# Patient Record
Sex: Male | Born: 1980 | Race: White | Hispanic: No | Marital: Single | Smoking: Current every day smoker
Health system: Southern US, Community
[De-identification: ages and names within clinical notes are randomized; demographics above are authoritative.]

## PROBLEM LIST (undated history)

## (undated) DIAGNOSIS — K5792 Diverticulitis of intestine, part unspecified, without perforation or abscess without bleeding: Secondary | ICD-10-CM

## (undated) HISTORY — PX: CLEFT LIP REPAIR: SUR1164

---

## 2001-12-28 ENCOUNTER — Emergency Department (HOSPITAL_COMMUNITY): Admission: EM | Admit: 2001-12-28 | Discharge: 2001-12-28 | Payer: Self-pay | Admitting: Emergency Medicine

## 2001-12-28 ENCOUNTER — Encounter: Payer: Self-pay | Admitting: Emergency Medicine

## 2003-08-22 ENCOUNTER — Emergency Department (HOSPITAL_COMMUNITY): Admission: EM | Admit: 2003-08-22 | Discharge: 2003-08-22 | Payer: Self-pay | Admitting: *Deleted

## 2003-09-24 ENCOUNTER — Inpatient Hospital Stay (HOSPITAL_COMMUNITY): Admission: EM | Admit: 2003-09-24 | Discharge: 2003-09-26 | Payer: Self-pay | Admitting: Emergency Medicine

## 2007-08-18 ENCOUNTER — Emergency Department (HOSPITAL_COMMUNITY): Admission: EM | Admit: 2007-08-18 | Discharge: 2007-08-18 | Payer: Self-pay | Admitting: Emergency Medicine
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
## 2012-10-16 ENCOUNTER — Emergency Department (HOSPITAL_COMMUNITY): Payer: No Typology Code available for payment source

## 2012-10-16 ENCOUNTER — Encounter (HOSPITAL_COMMUNITY): Payer: Self-pay | Admitting: Nurse Practitioner

## 2012-10-16 ENCOUNTER — Emergency Department (HOSPITAL_COMMUNITY)
Admission: EM | Admit: 2012-10-16 | Discharge: 2012-10-16 | Disposition: A | Discharge status: Home / Self Care | Payer: No Typology Code available for payment source | Attending: Emergency Medicine | Admitting: Emergency Medicine

## 2012-10-16 DIAGNOSIS — M546 Pain in thoracic spine: Secondary | ICD-10-CM | POA: Insufficient documentation

## 2012-10-16 DIAGNOSIS — Y92410 Unspecified street and highway as the place of occurrence of the external cause: Secondary | ICD-10-CM | POA: Insufficient documentation

## 2012-10-16 DIAGNOSIS — M25532 Pain in left wrist: Secondary | ICD-10-CM

## 2012-10-16 DIAGNOSIS — F17200 Nicotine dependence, unspecified, uncomplicated: Secondary | ICD-10-CM | POA: Insufficient documentation

## 2012-10-16 DIAGNOSIS — M25519 Pain in unspecified shoulder: Secondary | ICD-10-CM | POA: Insufficient documentation

## 2012-10-16 DIAGNOSIS — M25512 Pain in left shoulder: Secondary | ICD-10-CM

## 2012-10-16 DIAGNOSIS — M25529 Pain in unspecified elbow: Secondary | ICD-10-CM | POA: Insufficient documentation

## 2012-10-16 DIAGNOSIS — Y9389 Activity, other specified: Secondary | ICD-10-CM | POA: Insufficient documentation

## 2012-10-16 MED ORDER — ACETAMINOPHEN 500 MG PO TABS: 500.0000 mg | ORAL_TABLET | Freq: Four times a day (QID) | ORAL | Status: DC | PRN

## 2012-10-16 MED ORDER — HYDROCODONE-ACETAMINOPHEN 5-325 MG PO TABS
2.0000 | ORAL_TABLET | Freq: Once | ORAL | Status: AC
  Administered 2012-10-16: 2 via ORAL
  Filled 2012-10-16: qty 2
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
## 2012-10-17 NOTE — ED Provider Notes (Signed)
Medical screening examination/treatment/procedure(s) were performed by non-physician practitioner and as supervising physician I was immediately available for consultation/collaboration.   Junius Argyle, MD 10/17/12 1328

## 2014-04-16 ENCOUNTER — Emergency Department (HOSPITAL_BASED_OUTPATIENT_CLINIC_OR_DEPARTMENT_OTHER): Payer: Medicaid Other

## 2014-04-16 ENCOUNTER — Encounter (HOSPITAL_BASED_OUTPATIENT_CLINIC_OR_DEPARTMENT_OTHER): Payer: Self-pay | Admitting: *Deleted

## 2014-04-16 ENCOUNTER — Emergency Department (HOSPITAL_BASED_OUTPATIENT_CLINIC_OR_DEPARTMENT_OTHER)
Admission: EM | Admit: 2014-04-16 | Discharge: 2014-04-16 | Disposition: A | Discharge status: Home / Self Care | Payer: Medicaid Other | Attending: Emergency Medicine | Admitting: Emergency Medicine

## 2014-04-16 DIAGNOSIS — Y9389 Activity, other specified: Secondary | ICD-10-CM | POA: Insufficient documentation

## 2014-04-16 DIAGNOSIS — Y998 Other external cause status: Secondary | ICD-10-CM | POA: Diagnosis not present

## 2014-04-16 DIAGNOSIS — Z720 Tobacco use: Secondary | ICD-10-CM | POA: Diagnosis not present

## 2014-04-16 DIAGNOSIS — W230XXA Caught, crushed, jammed, or pinched between moving objects, initial encounter: Secondary | ICD-10-CM | POA: Insufficient documentation

## 2014-04-16 DIAGNOSIS — Y9289 Other specified places as the place of occurrence of the external cause: Secondary | ICD-10-CM | POA: Diagnosis not present

## 2014-04-16 DIAGNOSIS — S6991XA Unspecified injury of right wrist, hand and finger(s), initial encounter: Secondary | ICD-10-CM | POA: Diagnosis not present

## 2014-04-16 MED ORDER — OXYCODONE-ACETAMINOPHEN 5-325 MG PO TABS: 1.0000 | ORAL_TABLET | Freq: Four times a day (QID) | ORAL | Status: DC | PRN

## 2014-04-16 MED ORDER — IBUPROFEN 600 MG PO TABS: 600.0000 mg | ORAL_TABLET | Freq: Four times a day (QID) | ORAL | Status: DC | PRN

## 2014-04-16 MED ORDER — OXYCODONE-ACETAMINOPHEN 5-325 MG PO TABS
1.0000 | ORAL_TABLET | Freq: Once | ORAL | Status: AC
  Administered 2014-04-16: 1 via ORAL
  Filled 2014-04-16: qty 1

## 2014-04-16 MED ORDER — IBUPROFEN 800 MG PO TABS
800.0000 mg | ORAL_TABLET | Freq: Once | ORAL | Status: AC
Start: 2014-04-16 — End: 2014-04-16
  Administered 2014-04-16: 800 mg via ORAL
  Filled 2014-04-16: qty 1

## 2014-04-16 NOTE — ED Provider Notes (Signed)
CSN: [id number]     Arrival date & time 04/16/14  1939 History   First MD Initiated Contact with Patient 04/16/14 2001     Chief Complaint  Patient presents with  . Hand Injury     (Consider location/radiation/quality/duration/timing/severity/associated sxs/prior Treatment) HPI Pt is a 34yo male presenting to ED with c/o sudden onset severe Right hand pain after he got it caught in a mixing bowl in cooking class this evening while making a tart.  Pt states he was feeling the texture of his mixture when the machine accidentally got switched back on.  Pain is severe, aching and throbbing, 10/10, worse with palpation and movement.  Pt states pain is worse over his knuckles of index, middle, and ring finger with tingling sensation in those fingers. No wound but reports bruising. No pain medication PTA as pt states he came straight to the ED.  No other injuries.    History reviewed. No pertinent past medical history. History reviewed. No pertinent past surgical history. No family history on file. History  Substance Use Topics  . Smoking status: Current Every Day Smoker    Types: Cigarettes  . Smokeless tobacco: Not on file  . Alcohol Use: No    Review of Systems  Musculoskeletal: Positive for myalgias, joint swelling and arthralgias.  Skin: Positive for color change. Negative for wound.  Neurological: Positive for weakness ( Right hand- due to pain) and numbness ( tingling in Right fingers ).  All other systems reviewed and are negative.     Allergies  Review of patient's allergies indicates no known allergies.  Home Medications   Prior to Admission medications   Medication Sig Start Date End Date Taking? Authorizing Provider  acetaminophen (TYLENOL) 500 MG tablet Take 1 tablet (500 mg total) by mouth every 6 (six) hours as needed for pain. 10/16/12   Junius FinnerErin O'Malley, PA-C  ibuprofen (ADVIL,MOTRIN) 600 MG tablet Take 1 tablet (600 mg total) by mouth every 6 (six) hours as needed.  04/16/14   Junius FinnerErin O'Malley, PA-C  oxyCODONE-acetaminophen (PERCOCET/ROXICET) 5-325 MG per tablet Take 1-2 tablets by mouth every 6 (six) hours as needed for severe pain. 04/16/14   Junius FinnerErin O'Malley, PA-C   BP 141/92 mmHg  Pulse 71  Temp(Src) 98.7 F (37.1 C) (Oral)  Resp 17  Ht 5\' 9"  (1.753 m)  Wt 175 lb (79.379 kg)  BMI 25.83 kg/m2  SpO2 100% Physical Exam  Constitutional: He is oriented to person, place, and time. He appears well-developed and well-nourished.  HENT:  Head: Normocephalic and atraumatic.  Eyes: EOM are normal.  Neck: Normal range of motion.  Cardiovascular: Normal rate.   Pulses:      Radial pulses are 2+ on the right side.  Right hand: cap refill <3 seconds  Pulmonary/Chest: Effort normal.  Musculoskeletal: He exhibits edema and tenderness.       Right hand: He exhibits decreased range of motion, tenderness and swelling.  Right hand: moderate edema to dorsal aspect of distal metacarpals 2, 3, and 4 with tenderness. Decreased ROM of index, middle, and ring finger due to severe pain over MCPs.  FROM PIPs and DIPs.  Increased pain radiating to MCPs with wrist flexion and extension but no wrist tenderness. Minimal tenderness to palmar aspect of Right hand.  Compartments are soft.  Neurological: He is alert and oriented to person, place, and time.  Right hand: altered sensation with light touch in index, middle and ring finger.  "tingling" sensation  Skin: Skin is warm and  dry.  Right hand.  Various tattoos but also significant edema to dorsal aspect Right hand over 2nd, 3rd, and 4th MCPs. Skin in tact.   Psychiatric: He has a normal mood and affect. His behavior is normal.  Nursing note and vitals reviewed.   ED Course  Procedures (including critical care time) Labs Review Labs Reviewed - No data to display  Imaging Review Dg Hand Complete Right  04/16/2014   CLINICAL DATA:  Hand injury, medial and fourth finger pain and swelling  EXAM: RIGHT HAND - COMPLETE 3+ VIEW   COMPARISON:  None.  FINDINGS: Three views of the right hand submitted. No acute fracture or subluxation. There is mild soft tissue swelling distal dorsal metacarpal region.  IMPRESSION: Mild soft tissue swelling distal dorsal metacarpal region. No acute fracture or subluxation   Electronically Signed   By: Natasha Mead M.D.   On: 04/16/2014 20:17     EKG Interpretation None      MDM   Final diagnoses:  Hand injury, right, initial encounter   Pt c/o severe Right hand pain after getting it caught in a mixing bowl while cooking earlier this evening. Skin is intact. Compartments soft with moderate edema and tenderness over Right MCPs. Limited ROM due to severe pain. Plain films: negative for fracture or subluxation. Doubt compartment syndrome. Will place in wrist splint and keep elevated with a sling. Rx: percocet and ibuprofen. Home care instructions provided. Advised to f/u with Dr. Melvyn Novas, hand surgery, if symptoms not improving. Return precautions provided. Pt verbalized understanding and agreement with tx plan.     Junius Finner, PA-C 04/16/14 2156  Gwyneth Sprout, MD 04/16/14 [phone]

## 2014-04-16 NOTE — ED Notes (Signed)
Right hand injury. 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 Swelling and pain.

## 2015-07-15 ENCOUNTER — Encounter (HOSPITAL_COMMUNITY): Payer: Self-pay | Admitting: Emergency Medicine

## 2015-07-15 ENCOUNTER — Emergency Department (HOSPITAL_COMMUNITY)
Admission: EM | Admit: 2015-07-15 | Discharge: 2015-07-16 | Disposition: A | Discharge status: Home / Self Care | Payer: Medicaid Other | Attending: Emergency Medicine | Admitting: Emergency Medicine

## 2015-07-15 DIAGNOSIS — N200 Calculus of kidney: Secondary | ICD-10-CM | POA: Diagnosis not present

## 2015-07-15 DIAGNOSIS — F17210 Nicotine dependence, cigarettes, uncomplicated: Secondary | ICD-10-CM | POA: Insufficient documentation

## 2015-07-15 DIAGNOSIS — R1032 Left lower quadrant pain: Secondary | ICD-10-CM | POA: Diagnosis present

## 2015-07-15 HISTORY — DX: Diverticulitis of intestine, part unspecified, without perforation or abscess without bleeding: K57.92

## 2015-07-15 MED ORDER — ONDANSETRON HCL 4 MG/2ML IJ SOLN
4.0000 mg | Freq: Once | INTRAMUSCULAR | Status: AC
  Administered 2015-07-16: 4 mg via INTRAVENOUS
  Filled 2015-07-15: qty 2

## 2015-07-15 MED ORDER — SODIUM CHLORIDE 0.9 % IV BOLUS (SEPSIS)
1000.0000 mL | Freq: Once | INTRAVENOUS | Status: AC
  Administered 2015-07-16: 1000 mL via INTRAVENOUS

## 2015-07-15 MED ORDER — HYDROMORPHONE HCL 1 MG/ML IJ SOLN
1.0000 mg | Freq: Once | INTRAMUSCULAR | Status: AC
  Administered 2015-07-16: 1 mg via INTRAVENOUS
  Filled 2015-07-15: qty 1

## 2015-07-15 MED ORDER — MORPHINE SULFATE (PF) 4 MG/ML IV SOLN
4.0000 mg | Freq: Once | INTRAVENOUS | Status: DC
Start: 2015-07-16 — End: 2015-07-15

## 2015-07-15 MED ORDER — KETOROLAC TROMETHAMINE 30 MG/ML IJ SOLN
30.0000 mg | Freq: Once | INTRAMUSCULAR | Status: AC
  Administered 2015-07-16: 30 mg via INTRAVENOUS
  Filled 2015-07-15: qty 1

## 2015-07-15 NOTE — ED Notes (Signed)
Pt states that he was dx with a  L kidney [place] 2 weeks ago but he has run out his medication. States he has a urologist appt on Friday but cannot make it until then. Alert and oriented.

## 2015-07-15 NOTE — Progress Notes (Signed)
Patient listed as having [employer] access insurance.  Pcp list on patient's insurance card is listed at [facility].  Patient confirms this is still his pcp.  Patient reports he has an appointment with a urologist on Friday.  Patient requesting something for pain.  EDCM informed EDRN.

## 2015-07-15 NOTE — ED Provider Notes (Signed)
By signing my name below, I, Vista Minkobert Ross, attest that this documentation has been prepared under the direction and in the presence of Keven Soucy N Cephas Revard, DO. Electronically signed, Vista Minkobert Ross, ED Scribe. 07/16/2015. 3:26 AM.  TIME SEEN: 11:55pm  CHIEF COMPLAINT: Flank pain  HPI:  HPI Comments: Jose Lin is a 35 y.o. male with a PMHx of Diverticulitis, who presents to the Emergency Department complaining of constant left lower quadrant and left sided flank pain. Pt states he was diagnosed with a 4mm kidney Tweten 2 weeks ago but has run out of his medication. [facility] and had a 4 mm left proximal ureteral Langenfeld on CT scan with mild hydronephrosis on 06/28/2015. Pt reports he has a scheduled apt [facility] a [facility] Friday but states he cannot make it until then due to the pain. Pt also reports associated chills. Pt denies any diarrhea. Pt states he was given Toradol and Fentanyl at his last visit with no relief. Pt also states he has had a difficult time urinating or having a bowel movement.  ROS: See HPI Constitutional: no fever  Eyes: no drainage  ENT: no runny nose   Cardiovascular:  no chest pain  Resp: no SOB  GI: + Nausea and vomiting GU: no dysuria Integumentary: no rash  Allergy: no hives  Musculoskeletal: no leg swelling  Neurological: no slurred speech ROS otherwise negative  PAST MEDICAL HISTORY/PAST SURGICAL HISTORY:  Past Medical History  Diagnosis Date  . Diverticulitis     MEDICATIONS:  Prior to Admission medications   Medication Sig Start Date End Date Taking? Authorizing Provider  ibuprofen (ADVIL,MOTRIN) 200 MG tablet Take 400 mg by mouth every 6 (six) hours as needed for headache, mild pain or moderate pain.   Yes Historical Provider, MD    ALLERGIES:  Allergies  Allergen Reactions  . Tylenol [Acetaminophen] Nausea And Vomiting    SOCIAL HISTORY:  Social History  Substance Use Topics  . Smoking status: Current Every Day Smoker    Types: Cigarettes  . Smokeless tobacco: Not on file  . Alcohol Use: No   FAMILY HISTORY: History reviewed. No pertinent family history.  EXAM: BP 122/81 mmHg  Pulse 67  Temp(Src) 98.4 F (36.9 C) (Oral)  Resp 18  Ht 5\' 10"  (1.778 m)  Wt 178 lb (80.74 kg)  BMI 25.54 kg/m2  SpO2 100% CONSTITUTIONAL: Alert and oriented and responds appropriately to questions. Well-appearing; well-nourished. Appears uncomfortable. Afebrile, nontoxic. HEAD: Normocephalic EYES: Conjunctivae clear, PERRL ENT: normal nose; no rhinorrhea; moist mucous membranes NECK: Supple, no meningismus, no LAD  CARD: RRR; S1 and S2 appreciated; no murmurs, no clicks, no rubs, no gallops RESP: Normal chest excursion without splinting or tachypnea; breath sounds clear and equal bilaterally; no wheezes, no rhonchi, no rales, no hypoxia or respiratory distress, speaking full sentences ABD/GI: Normal bowel sounds; non-distended; soft, tender diffusely throughout left side, no rebound, no guarding, no peritoneal signs BACK:  The back appears normal and is non-tender to palpation, there is left sided CVA tenderness EXT: Normal ROM in all joints; non-tender to palpation; no edema; normal capillary refill; no cyanosis, no calf tenderness or swelling    SKIN: Normal color for age and race; warm; no rash NEURO: Moves all extremities equally, sensation to light touch intact diffusely, cranial nerves II through XII intact PSYCH: The patient's mood and manner are appropriate. Grooming and personal hygiene are appropriate.  MEDICAL DECISION MAKING: Patient here with left-sided abdominal pain and flank pain.  Recently diagnosed with a 4 mm left proximal ureteral Mccutcheon with mild hydronephrosis. We'll give Toradol, Dilaudid, Zofran and reassess. We'll obtain labs, urine and an x-ray.  ED PROGRESS: Patient continues to have pain out of proportion with his exam. He has a mild leukocytosis but otherwise labs are unremarkable. Urine shows  blood but no other sign of infection. Given he is having persistent pain we will obtain a repeat CT scan.  CT scan shows 4 mm Bodin now at the level of the iliac vasculature. Hydronephrosis is improving. We have given him 30 mg of IV Toradol, 2 mg of IV Dilaudid, IV fluids, Zofran without any relief in pain. We'll continue to work on pain control.   5:15 AM  Pt has now had 4 mg of IV Dilaudid total and 6 mg of IV morphine, 30 mg of IV Toradol reports his pain is still an 8.5/10. States he does not feel comfortable going home. D/w Dr. Marlou PorchHerrick with urology who states that they can offer the patient is stent this morning. Patient agrees to this plan given intractable pain. He will be consented. He has been NPO for over 7 hours.   6:05 AM  Pt now refuses to go to OR.  Dr. Marlou PorchHerrick has seen pt in ED.  Appreciate his help.  Will dc patient home with prescription for Dilaudid, Flomax, ibuprofen, Zofran. Have given him outpatient follow-up. Discussed return precautions.   At this time, I do not feel there is any life-threatening condition present. I have reviewed and discussed all results (EKG, imaging, lab, urine as appropriate), exam findings with patient. I have reviewed nursing notes and appropriate previous records.  I feel the patient is safe to be discharged home without further emergent workup. Discussed usual and customary return precautions. Patient and family (if present) verbalize understanding and are comfortable with this plan.  Patient will follow-up with their primary care provider. If they do not have a primary care provider, information for follow-up has been provided to them. All questions have been answered.   This chart was scribed in my presence and reviewed by me personally.   Layla MawKristen N Fareedah Mahler, DO 07/16/15 [phone]

## 2015-07-16 ENCOUNTER — Encounter (HOSPITAL_COMMUNITY)
Admission: EM | Disposition: A | Discharge status: Home / Self Care | Payer: Self-pay | Source: Home / Self Care | Attending: Emergency Medicine

## 2015-07-16 ENCOUNTER — Other Ambulatory Visit (HOSPITAL_COMMUNITY): Payer: Medicaid Other

## 2015-07-16 ENCOUNTER — Emergency Department (HOSPITAL_COMMUNITY): Payer: Medicaid Other

## 2015-07-16 LAB — URINE MICROSCOPIC-ADD ON
Bacteria, UA: NONE SEEN
Squamous Epithelial / LPF: NONE SEEN

## 2015-07-16 LAB — CBC WITH DIFFERENTIAL/PLATELET
BASOS PCT: 0 %
Basophils Absolute: 0 10*3/uL (ref 0.0–0.1)
EOS ABS: 0.3 10*3/uL (ref 0.0–0.7)
Eosinophils Relative: 2 %
HEMATOCRIT: 46.3 % (ref 39.0–52.0)
HEMOGLOBIN: 16.4 g/dL (ref 13.0–17.0)
Lymphocytes Relative: 33 %
Lymphs Abs: 3.6 10*3/uL (ref 0.7–4.0)
MCH: 31.5 pg (ref 26.0–34.0)
MCHC: 35.4 g/dL (ref 30.0–36.0)
MCV: 88.9 fL (ref 78.0–100.0)
Monocytes Absolute: 0.9 10*3/uL (ref 0.1–1.0)
Monocytes Relative: 9 %
Neutro Abs: 6.2 10*3/uL (ref 1.7–7.7)
Neutrophils Relative %: 56 %
Platelets: 299 10*3/uL (ref 150–400)
RBC: 5.21 MIL/uL (ref 4.22–5.81)
RDW: 12.4 % (ref 11.5–15.5)
WBC: 11 10*3/uL — AB (ref 4.0–10.5)

## 2015-07-16 LAB — URINALYSIS, ROUTINE W REFLEX MICROSCOPIC
BILIRUBIN URINE: NEGATIVE
Glucose, UA: NEGATIVE mg/dL
KETONES UR: NEGATIVE mg/dL
LEUKOCYTES UA: NEGATIVE
NITRITE: NEGATIVE
PH: 6.5 (ref 5.0–8.0)
Protein, ur: NEGATIVE mg/dL
SPECIFIC GRAVITY, URINE: 1.024 (ref 1.005–1.030)

## 2015-07-16 LAB — COMPREHENSIVE METABOLIC PANEL
ALBUMIN: 4.1 g/dL (ref 3.5–5.0)
ALK PHOS: 50 U/L (ref 38–126)
ALT: 49 U/L (ref 17–63)
AST: 29 U/L (ref 15–41)
Anion gap: 9 (ref 5–15)
BILIRUBIN TOTAL: 0.8 mg/dL (ref 0.3–1.2)
BUN: 11 mg/dL (ref 6–20)
CALCIUM: 8.8 mg/dL — AB (ref 8.9–10.3)
CO2: 23 mmol/L (ref 22–32)
CREATININE: 0.85 mg/dL (ref 0.61–1.24)
Chloride: 103 mmol/L (ref 101–111)
GFR calc Af Amer: >60 mL/min (ref >60)
GFR calc non Af Amer: >60 mL/min (ref >60)
GLUCOSE: 84 mg/dL (ref 65–99)
Potassium: 3.6 mmol/L (ref 3.5–5.1)
SODIUM: 135 mmol/L (ref 135–145)
TOTAL PROTEIN: 7 g/dL (ref 6.5–8.1)

## 2015-07-16 LAB — LIPASE, BLOOD: Lipase: 23 U/L (ref 11–51)

## 2015-07-16 SURGERY — CYSTOSCOPY, WITH CALCULUS REMOVAL USING BASKET
Anesthesia: Choice | Laterality: Left

## 2015-07-16 MED ORDER — PROPOFOL 10 MG/ML IV BOLUS
INTRAVENOUS | Status: AC
  Filled 2015-07-16: qty 20

## 2015-07-16 MED ORDER — TAMSULOSIN HCL 0.4 MG PO CAPS
0.4000 mg | ORAL_CAPSULE | Freq: Once | ORAL | Status: AC
  Administered 2015-07-16: 0.4 mg via ORAL
  Filled 2015-07-16: qty 1

## 2015-07-16 MED ORDER — MIDAZOLAM HCL 2 MG/2ML IJ SOLN
INTRAMUSCULAR | Status: AC
  Filled 2015-07-16: qty 2

## 2015-07-16 MED ORDER — HYDROMORPHONE HCL 2 MG/ML IJ SOLN
2.0000 mg | Freq: Once | INTRAMUSCULAR | Status: AC
  Administered 2015-07-16: 2 mg via INTRAVENOUS
  Filled 2015-07-16: qty 1

## 2015-07-16 MED ORDER — SODIUM CHLORIDE 0.9 % IV SOLN
INTRAVENOUS | Status: DC
  Administered 2015-07-16: 06:00:00 via INTRAVENOUS

## 2015-07-16 MED ORDER — HYDROMORPHONE HCL 1 MG/ML IJ SOLN
1.0000 mg | Freq: Once | INTRAMUSCULAR | Status: AC
  Administered 2015-07-16: 1 mg via INTRAVENOUS
  Filled 2015-07-16: qty 1

## 2015-07-16 MED ORDER — MORPHINE SULFATE (PF) 4 MG/ML IV SOLN
6.0000 mg | Freq: Once | INTRAVENOUS | Status: AC
  Administered 2015-07-16: 6 mg via INTRAVENOUS
  Filled 2015-07-16: qty 2

## 2015-07-16 MED ORDER — HYDROMORPHONE HCL 2 MG PO TABS: 2.0000 mg | ORAL_TABLET | Freq: Four times a day (QID) | ORAL | Status: DC | PRN

## 2015-07-16 MED ORDER — IBUPROFEN 800 MG PO TABS: 800.0000 mg | ORAL_TABLET | Freq: Three times a day (TID) | ORAL | Status: AC | PRN

## 2015-07-16 MED ORDER — ONDANSETRON 4 MG PO TBDP: 4.0000 mg | ORAL_TABLET | Freq: Three times a day (TID) | ORAL | Status: AC | PRN

## 2015-07-16 MED ORDER — TAMSULOSIN HCL 0.4 MG PO CAPS: 0.4000 mg | ORAL_CAPSULE | Freq: Every day | ORAL | Status: AC

## 2015-07-16 MED ORDER — FENTANYL CITRATE (PF) 100 MCG/2ML IJ SOLN
INTRAMUSCULAR | Status: AC
  Filled 2015-07-16: qty 2

## 2015-07-16 NOTE — Discharge Instructions (Signed)
Dietary Guidelines to Help Prevent Kidney Stones °Your risk of kidney stones can be decreased by adjusting the foods you eat. The most important thing you can do is drink enough fluid. You should drink enough fluid to keep your urine clear or pale yellow. The following guidelines provide specific information for the type of kidney Wiegman you have had. °GUIDELINES ACCORDING TO TYPE OF KIDNEY [place] °Calcium Oxalate Kidney Stones °· Reduce the amount of salt you eat. Foods that have a lot of salt cause your body to release excess calcium into your urine. The excess calcium can combine with a substance called oxalate to form kidney stones. °· Reduce the amount of animal protein you eat if the amount you eat is excessive. Animal protein causes your body to release excess calcium into your urine. Ask your dietitian how much protein from animal sources you should be eating. °· Avoid foods that are high in oxalates. If you take vitamins, they should have less than 500 mg of vitamin C. Your body turns vitamin C into oxalates. You do not need to avoid fruits and vegetables high in vitamin C. °Calcium Phosphate Kidney Stones °· Reduce the amount of salt you eat to help prevent the release of excess calcium into your urine. °· Reduce the amount of animal protein you eat if the amount you eat is excessive. Animal protein causes your body to release excess calcium into your urine. Ask your dietitian how much protein from animal sources you should be eating. °· Get enough calcium from food or take a calcium supplement (ask your dietitian for recommendations). Food sources of calcium that do not increase your risk of kidney stones include: °¨ Broccoli. °¨ Dairy products, such as cheese and yogurt. °¨ Pudding. °Uric Acid Kidney Stones °· Do not have more than 6 oz of animal protein per day. °FOOD SOURCES °Animal Protein Sources °· Meat (all types). °· Poultry. °· Eggs. °· Fish, seafood. °Foods High in Salt °· Salt seasonings. °· Soy  sauce. °· Teriyaki sauce. °· Cured and processed meats. °· Salted crackers and snack foods. °· Fast food. °· Canned soups and most canned foods. °Foods High in Oxalates °· Grains: °¨ Amaranth. °¨ Barley. °¨ Grits. °¨ Wheat germ. °¨ Bran. °¨ Buckwheat flour. °¨ All bran cereals. °¨ Pretzels. °¨ Whole wheat bread. °· Vegetables: °¨ Beans (wax). °¨ Beets and beet greens. °¨ Collard greens. °¨ Eggplant. °¨ Escarole. °¨ Leeks. °¨ Okra. °¨ Parsley. °¨ Rutabagas. °¨ Spinach. °¨ Swiss chard. °¨ Tomato paste. °¨ Fried potatoes. °¨ Sweet potatoes. °· Fruits: °¨ Red currants. °¨ Figs. °¨ Kiwi. °¨ Rhubarb. °· Meat and Other Protein Sources: °¨ Beans (dried). °¨ Soy burgers and other soybean products. °¨ Miso. °¨ Nuts (peanuts, almonds, pecans, cashews, hazelnuts). °¨ Nut butters. °¨ Sesame seeds and tahini (paste made of sesame seeds). °¨ Poppy seeds. °· Beverages: °¨ Chocolate drink mixes. °¨ Soy milk. °¨ Instant iced tea. °¨ Juices made from high-oxalate fruits or vegetables. °· Other: °¨ Carob. °¨ Chocolate. °¨ Fruitcake. °¨ Marmalades. °  °This information is not intended to replace advice given to you by your health care provider. Make sure you discuss any questions you have with your health care provider. °  °Document Released: 05/01/2010 Document Revised: 01/09/2013 Document Reviewed: 12/01/2012 °Elsevier Interactive Patient Education ©2016 Elsevier Inc. ° ° °Kidney Stones °Kidney stones (urolithiasis) are deposits that form inside your kidneys. The intense pain is caused by the Mader moving through the urinary tract. When the Casella moves, the   ureter goes into spasm around the Celaya. The Lindenberger is usually passed in the urine.  °CAUSES  °· A disorder that makes certain neck glands produce too much parathyroid hormone (primary hyperparathyroidism). °· A buildup of uric acid crystals, similar to gout in your joints. °· Narrowing (stricture) of the ureter. °· A kidney obstruction present at birth (congenital  obstruction). °· Previous surgery on the kidney or ureters. °· Numerous kidney infections. °SYMPTOMS  °· Feeling sick to your stomach (nauseous). °· Throwing up (vomiting). °· Blood in the urine (hematuria). °· Pain that usually spreads (radiates) to the groin. °· Frequency or urgency of urination. °DIAGNOSIS  °· Taking a history and physical exam. °· Blood or urine tests. °· CT scan. °· Occasionally, an examination of the inside of the urinary bladder (cystoscopy) is performed. °TREATMENT  °· Observation. °· Increasing your fluid intake. °· Extracorporeal shock wave lithotripsy--This is a noninvasive procedure that uses shock waves to break up kidney stones. °· Surgery may be needed if you have severe pain or persistent obstruction. There are various surgical procedures. Most of the procedures are performed with the use of small instruments. Only small incisions are needed to accommodate these instruments, so recovery time is minimized. °The size, location, and chemical composition are all important variables that will determine the proper choice of action for you. Talk to your health care provider to better understand your situation so that you will minimize the risk of injury to yourself and your kidney.  °HOME CARE INSTRUCTIONS  °· Drink enough water and fluids to keep your urine clear or pale yellow. This will help you to pass the Spooner or Derks fragments. °· Strain all urine through the provided strainer. Keep all particulate matter and stones for your health care provider to see. The Aung causing the pain may be as small as a grain of salt. It is very important to use the strainer each and every time you pass your urine. The collection of your Mees will allow your health care provider to analyze it and verify that a Gieger has actually passed. The Aubuchon analysis will often identify what you can do to reduce the incidence of recurrences. °· Only take over-the-counter or prescription medicines for pain,  discomfort, or fever as directed by your health care provider. °· Keep all follow-up visits as told by your health care provider. This is important. °· Get follow-up X-rays if required. The absence of pain does not always mean that the Kush has passed. It may have only stopped moving. If the urine remains completely obstructed, it can cause loss of kidney function or even complete destruction of the kidney. It is your responsibility to make sure X-rays and follow-ups are completed. Ultrasounds of the kidney can show blockages and the status of the kidney. Ultrasounds are not associated with any radiation and can be performed easily in a matter of minutes. °· Make changes to your daily diet as told by your health care provider. You may be told to: °¨ Limit the amount of salt that you eat. °¨ Eat 5 or more servings of fruits and vegetables each day. °¨ Limit the amount of meat, poultry, fish, and eggs that you eat. °· Collect a 24-hour urine sample as told by your health care provider. You may need to collect another urine sample every 6-12 months. °SEEK MEDICAL CARE IF: °· You experience pain that is progressive and unresponsive to any pain medicine you have been prescribed. °SEEK IMMEDIATE MEDICAL CARE IF:  °·   Pain cannot be controlled with the prescribed medicine. °· You have a fever or shaking chills. °· The severity or intensity of pain increases over 18 hours and is not relieved by pain medicine. °· You develop a new onset of abdominal pain. °· You feel faint or pass out. °· You are unable to urinate. °  °This information is not intended to replace advice given to you by your health care provider. Make sure you discuss any questions you have with your health care provider. °  °Document Released: 01/04/2005 Document Revised: 09/25/2014 Document Reviewed: 06/07/2012 °Elsevier Interactive Patient Education ©2016 Elsevier Inc. ° °

## 2015-07-16 NOTE — ED Notes (Signed)
Pt transported to XR.  

## 2015-07-16 NOTE — ED Notes (Signed)
Surgeon at bedside.  

## 2015-07-16 NOTE — ED Notes (Signed)
Pt requesting to speak to the surgeon before signing the consent form. Surgeon is in route.

## 2015-07-16 NOTE — ED Notes (Signed)
Pt made aware of "NPO" status.  

## 2015-07-16 NOTE — ED Notes (Signed)
MD at bedside. 

## 2015-07-16 NOTE — Consult Note (Signed)
I have been asked to see the patient by Dr. Farrell OursKristine Ward, DO, for evaluation and management of left nephrolithiasis.  History of present illness: 68M who presented to the ED with left flank pain.  He was initially diagnosed with a 4mm Burkhead 2 weeks ago.  He has had constant pain on his left side that has progressed.  He has associated nausea, but denies any fevers or dysuria.  His pain is 10/10.  In the ER he has had 4mg  IV dilaudid, 5mg  IV morphine and Toradol and his pain continues to be uncontrolled.  The patient's PMH is significant for diverticulitis.  Review of systems: A 12 point comprehensive review of systems was obtained and is negative unless otherwise stated in the history of present illness.  There are no active problems to display for this patient.   No current facility-administered medications on file prior to encounter.   No current outpatient prescriptions on file prior to encounter.    Past Medical History  Diagnosis Date  . Diverticulitis     History reviewed. No pertinent past surgical history.  Social History  Substance Use Topics  . Smoking status: Current Every Day Smoker    Types: Cigarettes  . Smokeless tobacco: None  . Alcohol Use: No    History reviewed. No pertinent family history.  PE: Filed Vitals:   07/15/15 2202 07/16/15 0151 07/16/15 0355  BP: 122/81 121/73 158/103  Pulse: 67 77 69  Temp: 98.4 F (36.9 C)    TempSrc: Oral    Resp: 18 18 20   Height: 5\' 10"  (1.778 m)    Weight: 80.74 kg (178 lb)    SpO2: 100% 97% 98%   Patient appears to be in no acute distress  patient is alert and oriented x3 Atraumatic normocephalic head No cervical or supraclavicular lymphadenopathy appreciated No increased work of breathing, no audible wheezes/rhonchi Regular sinus rhythm/rate Abdomen is soft, nontender, nondistended, severe left sided tenderness Lower extremities are symmetric without appreciable edema Grossly neurologically intact No  identifiable skin lesions   Recent Labs  07/15/15 0005  WBC 11.0*  HGB 16.4  HCT 46.3    Recent Labs  07/15/15 0005  NA 135  K 3.6  CL 103  CO2 23  GLUCOSE 84  BUN 11  CREATININE 0.85  CALCIUM 8.8*   No results for input(s): LABPT, INR in the last 72 hours. No results for input(s): LABURIN in the last 72 hours. No results found for this or any previous visit.  Imaging: I have independently reviewed the patient's CT scan demonstrating a left 4mm Giovanni in the mid-ureter.  There is minimal hydronephrosis.  Imp: 68M with poorly controlled pain secondary to a 4mm Paige in his left mid-ureter  Recommendations: After prolonged discussion with the patient about ureteroscopy and the potential risks the patient would like to continue with medical explusion therapy.  He decided that his pain wasn't that bad.  He has a follow-up appointment in [place] at the end of the week which I encouraged him to keep.  Okay to discharge home with return precautions.  Berniece SalinesHERRICK, BENJAMIN W

## 2017-11-06 ENCOUNTER — Emergency Department (HOSPITAL_COMMUNITY)
Admission: EM | Admit: 2017-11-06 | Discharge: 2017-11-06 | Disposition: A | Discharge status: Home / Self Care | Payer: Medicaid Other | Attending: Emergency Medicine | Admitting: Emergency Medicine

## 2017-11-06 ENCOUNTER — Emergency Department (HOSPITAL_COMMUNITY): Payer: Medicaid Other

## 2017-11-06 ENCOUNTER — Other Ambulatory Visit: Payer: Self-pay

## 2017-11-06 ENCOUNTER — Encounter (HOSPITAL_COMMUNITY): Payer: Self-pay | Admitting: *Deleted

## 2017-11-06 DIAGNOSIS — N200 Calculus of kidney: Secondary | ICD-10-CM | POA: Insufficient documentation

## 2017-11-06 DIAGNOSIS — F17210 Nicotine dependence, cigarettes, uncomplicated: Secondary | ICD-10-CM | POA: Insufficient documentation

## 2017-11-06 LAB — URINALYSIS, ROUTINE W REFLEX MICROSCOPIC
GLUCOSE, UA: NEGATIVE mg/dL
HGB URINE DIPSTICK: NEGATIVE
KETONES UR: 20 mg/dL — AB
Leukocytes, UA: NEGATIVE
NITRITE: NEGATIVE
PROTEIN: 30 mg/dL — AB
Specific Gravity, Urine: 1.043 — ABNORMAL HIGH (ref 1.005–1.030)
pH: 7 (ref 5.0–8.0)

## 2017-11-06 LAB — COMPREHENSIVE METABOLIC PANEL
ALT: 16 U/L (ref 0–44)
AST: 19 U/L (ref 15–41)
Albumin: 3.8 g/dL (ref 3.5–5.0)
Alkaline Phosphatase: 57 U/L (ref 38–126)
Anion gap: 8 (ref 5–15)
BUN: 13 mg/dL (ref 6–20)
CHLORIDE: 107 mmol/L (ref 98–111)
CO2: 24 mmol/L (ref 22–32)
Calcium: 8.9 mg/dL (ref 8.9–10.3)
Creatinine, Ser: 1.47 mg/dL — ABNORMAL HIGH (ref 0.61–1.24)
GFR, EST NON AFRICAN AMERICAN: 60 mL/min — AB (ref >60)
Glucose, Bld: 93 mg/dL (ref 70–99)
POTASSIUM: 3.8 mmol/L (ref 3.5–5.1)
Sodium: 139 mmol/L (ref 135–145)
Total Bilirubin: 0.8 mg/dL (ref 0.3–1.2)
Total Protein: 6.5 g/dL (ref 6.5–8.1)

## 2017-11-06 LAB — CBC
HEMATOCRIT: 46.2 % (ref 39.0–52.0)
HEMOGLOBIN: 15.6 g/dL (ref 13.0–17.0)
MCH: 31.2 pg (ref 26.0–34.0)
MCHC: 33.8 g/dL (ref 30.0–36.0)
MCV: 92.4 fL (ref 80.0–100.0)
NRBC: 0 % (ref 0.0–0.2)
Platelets: 255 10*3/uL (ref 150–400)
RBC: 5 MIL/uL (ref 4.22–5.81)
RDW: 11.9 % (ref 11.5–15.5)
WBC: 11.6 10*3/uL — AB (ref 4.0–10.5)

## 2017-11-06 LAB — LIPASE, BLOOD: LIPASE: 93 U/L — AB (ref 11–51)

## 2017-11-06 MED ORDER — HYDROMORPHONE HCL 1 MG/ML IJ SOLN
1.0000 mg | Freq: Once | INTRAMUSCULAR | Status: AC
  Administered 2017-11-06: 1 mg via INTRAVENOUS
  Filled 2017-11-06: qty 1

## 2017-11-06 MED ORDER — ONDANSETRON HCL 4 MG/2ML IJ SOLN
4.0000 mg | Freq: Once | INTRAMUSCULAR | Status: AC
  Administered 2017-11-06: 4 mg via INTRAVENOUS
  Filled 2017-11-06: qty 2

## 2017-11-06 MED ORDER — SODIUM CHLORIDE 0.9 % IV BOLUS
500.0000 mL | Freq: Once | INTRAVENOUS | Status: AC
  Administered 2017-11-06: 500 mL via INTRAVENOUS

## 2017-11-06 MED ORDER — MORPHINE SULFATE (PF) 4 MG/ML IV SOLN
8.0000 mg | Freq: Once | INTRAVENOUS | Status: AC
  Administered 2017-11-06: 8 mg via INTRAVENOUS
  Filled 2017-11-06: qty 2

## 2017-11-06 MED ORDER — HYDROMORPHONE HCL 2 MG PO TABS: 2.0000 mg | ORAL_TABLET | Freq: Four times a day (QID) | ORAL | 0 refills | Status: AC | PRN

## 2017-11-06 MED ORDER — KETOROLAC TROMETHAMINE 30 MG/ML IJ SOLN
30.0000 mg | Freq: Once | INTRAMUSCULAR | Status: AC
  Administered 2017-11-06: 30 mg via INTRAVENOUS
  Filled 2017-11-06: qty 1

## 2017-11-06 NOTE — ED Notes (Signed)
ED Provider at bedside. 

## 2017-11-06 NOTE — ED Provider Notes (Signed)
[facility]-EMERGENCY DEPT Provider Note   CSN: [id number] Arrival date & time: 11/06/17  1811     History   Chief Complaint Chief Complaint  Patient presents with  . Abdominal Pain    HPI GENARO BEKKER is a 37 y.o. male.  Patient is a 37 year old male who presents with right flank pain.  He states that started about 5 days ago.  He describes a sharp pain in his right abdomen going to his right back and also right testicular pain.  He states at this time his main discomfort is in his right testicle.  He has had some associated nausea and vomiting.  He was seen at [facility] emergency department 2 days ago and had a CT scan which showed a 2 mm distal right ureteral [place].  He has been taking Percocet at home without improvement in symptoms.  He is currently out of the Percocet.  He was given a prescription for 8 tablets at that time.  He denies any known fevers.  He is having some difficulty with urination and has had some periodic hematuria.  He is followed by urologist in [facility] with [facility].     Past Medical History:  Diagnosis Date  . Diverticulitis     There are no active problems to display for this patient.   History reviewed. No pertinent surgical history.      Home Medications    Prior to Admission medications   Medication Sig Start Date End Date Taking? Authorizing Provider  erythromycin ophthalmic ointment Place 1 application into the right eye 2 (two) times daily.  11/03/17  Yes [provider]  ibuprofen (ADVIL,MOTRIN) 200 MG tablet Take 600 mg by mouth every 6 (six) hours as needed.   Yes [provider]  ketorolac (TORADOL) 10 MG tablet Take 10 mg by mouth every 6 (six) hours as needed for moderate pain or severe pain.  11/03/17  Yes [provider]  oxyCODONE (OXY IR/ROXICODONE) 5 MG immediate release tablet Take 5 mg by mouth every 6 (six) hours as needed for moderate pain or severe pain.   11/03/17  Yes [provider]  HYDROmorphone (DILAUDID) 2 MG tablet Take 1 tablet (2 mg total) by mouth every 6 (six) hours as needed for severe pain. 11/06/17   Rolan Bucco, MD  ibuprofen (ADVIL,MOTRIN) 800 MG tablet Take 1 tablet (800 mg total) by mouth every 8 (eight) hours as needed for mild pain. Patient not taking: Reported on 11/06/2017 07/16/15   Ward, Layla Maw, DO  ondansetron (ZOFRAN ODT) 4 MG disintegrating tablet Take 1 tablet (4 mg total) by mouth every 8 (eight) hours as needed for nausea or vomiting. Patient not taking: Reported on 11/06/2017 07/16/15   Ward, Layla Maw, DO  tamsulosin (FLOMAX) 0.4 MG CAPS capsule Take 1 capsule (0.4 mg total) by mouth daily. Patient not taking: Reported on 11/06/2017 07/16/15   Ward, Layla Maw, DO    Family History No family history on file.  Social History Social History   Tobacco Use  . Smoking status: Current Every Day Smoker    Types: Cigarettes  Substance Use Topics  . Alcohol use: No  . Drug use: No     Allergies   Tylenol [acetaminophen]   Review of Systems Review of Systems  Constitutional: Negative for chills, diaphoresis, fatigue and fever.  HENT: Negative for congestion, rhinorrhea and sneezing.   Eyes: Negative.   Respiratory: Negative for cough, chest tightness and shortness  of breath.   Cardiovascular: Negative for chest pain and leg swelling.  Gastrointestinal: Positive for abdominal pain, nausea and vomiting. Negative for blood in stool and diarrhea.  Genitourinary: Positive for flank pain, hematuria and testicular pain. Negative for difficulty urinating and frequency.  Musculoskeletal: Positive for back pain. Negative for arthralgias.  Skin: Negative for rash.  Neurological: Negative for dizziness, speech difficulty, weakness, numbness and headaches.     Physical Exam Updated Vital Signs BP (!) 149/94 (BP Location: Left Arm)   Pulse (!) 56   Temp 98.4 F (36.9 C) (Oral)   Resp 18   SpO2 100%    Physical Exam  Constitutional: He is oriented to person, place, and time. He appears well-developed and well-nourished. He appears distressed.  HENT:  Head: Normocephalic and atraumatic.  Eyes: Pupils are equal, round, and reactive to light.  Neck: Normal range of motion. Neck supple.  Cardiovascular: Normal rate, regular rhythm and normal heart sounds.  Pulmonary/Chest: Effort normal and breath sounds normal. No respiratory distress. He has no wheezes. He has no rales. He exhibits no tenderness.  Abdominal: Soft. Bowel sounds are normal. There is tenderness in the right lower quadrant. There is no rebound and no guarding.  Genitourinary: Right testis shows tenderness. Right testis shows no mass and no swelling.  Musculoskeletal: Normal range of motion. He exhibits no edema.  Lymphadenopathy:    He has no cervical adenopathy.  Neurological: He is alert and oriented to person, place, and time.  Skin: Skin is warm and dry. No rash noted.  Psychiatric: He has a normal mood and affect.     ED Treatments / Results  Labs (all labs ordered are listed, but only abnormal results are displayed) Labs Reviewed  LIPASE, BLOOD - Abnormal; Notable for the following components:      Result Value   Lipase 93 (*)    All other components within normal limits  COMPREHENSIVE METABOLIC PANEL - Abnormal; Notable for the following components:   Creatinine, Ser 1.47 (*)    GFR calc non Af Amer 60 (*)    All other components within normal limits  CBC - Abnormal; Notable for the following components:   WBC 11.6 (*)    All other components within normal limits  URINALYSIS, ROUTINE W REFLEX MICROSCOPIC - Abnormal; Notable for the following components:   Color, Urine AMBER (*)    APPearance CLOUDY (*)    Specific Gravity, Urine 1.043 (*)    Bilirubin Urine SMALL (*)    Ketones, ur 20 (*)    Protein, ur 30 (*)    Bacteria, UA FEW (*)    All other components within normal limits     EKG None  Radiology US Renal  Result Date: 11/06/2017 CLINICAL DATA:  Right flank pain EXAM: RENAL / URINARY TRACT ULTRASOUND COMPLETE COMPARISON:  CT 11/03/2017 FINDINGS: Right Kidney: Length: 11.6 cm. Mild right hydronephrosis, similar to prior CT. Normal echotexture. Left Kidney: Length: 9.6 cm. Echogenicity within normal limits. No mass or hydronephrosis visualized. Bladder: Small posterior cystic area noted along the right posterior bladder wall measures 7 mm. This could reflect a small ureterocele. IMPRESSION: Mild right hydronephrosis, similar to prior CT. Question small ureterocele on the right. Electronically Signed   By: Charlett Nose M.D.   On: 11/06/2017 20:38   US Scrotum W/doppler  Result Date: 11/06/2017 CLINICAL DATA:  Right flank pain EXAM: SCROTAL ULTRASOUND DOPPLER ULTRASOUND OF THE TESTICLES TECHNIQUE: Complete ultrasound examination of the testicles, epididymis, and other scrotal  structures was performed. Color and spectral Doppler ultrasound were also utilized to evaluate blood flow to the testicles. COMPARISON:  None. FINDINGS: Right testicle Measurements: 4.3 x 2.1 x 3.1 cm. No mass or microlithiasis visualized. Left testicle Measurements: 4.1 x 2.2 x 3.0 cm. No mass or microlithiasis visualized. Right epididymis:  Normal in size and appearance. Left epididymis:  Small 4 mm cyst, otherwise normal. Hydrocele:  None visualized. Varicocele:  None visualized. Pulsed Doppler interrogation of both testes demonstrates normal low resistance arterial and venous waveforms bilaterally. IMPRESSION: Unremarkable scrotal ultrasound. No evidence of testicular abnormality or torsion. Electronically Signed   By: Charlett Nose M.D.   On: 11/06/2017 20:12    Procedures Procedures (including critical care time)  Medications Ordered in ED Medications  ketorolac (TORADOL) 30 MG/ML injection 30 mg (30 mg Intravenous Given 11/06/17 1901)  HYDROmorphone (DILAUDID) injection 1 mg (1 mg  Intravenous Given 11/06/17 1901)  ondansetron (ZOFRAN) injection 4 mg (4 mg Intravenous Given 11/06/17 1906)  HYDROmorphone (DILAUDID) injection 1 mg (1 mg Intravenous Given 11/06/17 2052)  sodium chloride 0.9 % bolus 500 mL (0 mLs Intravenous Stopped 11/06/17 2221)  morphine 4 MG/ML injection 8 mg (8 mg Intravenous Given 11/06/17 2229)     Initial Impression / Assessment and Plan / ED Course  I have reviewed the triage vital signs and the nursing notes.  Pertinent labs & imaging results that were available during my care of the patient were reviewed by me and considered in my medical decision making (see chart for details).     Patient is a 37 year old male who presents with renal colic.  He has a known 2 to 3 mm distal ureteral [place].  Renal ultrasound shows mild hydro-.  He did have a testicular ultrasound because he was complaining of intense pain in his testicle even on direct palpation of the testicle.  This showed no evidence of torsion or other abnormality.  His urine does not appear infected.  He has a mild bump in his creatinine.  His pain was not well controlled in the ED and patient was requesting a urology evaluation.  He does have a urologist in [facility] but has been unable to be seen.  I spoke with Dr. Berneice Heinrich with urology.  Dr. Berneice Heinrich reviewed the images and feels that the patient has a very high chance of passing the Letts on his own without intervention.  He recommends discharging patient with 2 mg Dilaudid tablets for pain.  Patient has finished his oxycodone which she was previously prescribed.  He was previously given a prescription for only 8 tablets.  Dr. Berneice Heinrich advises that patient can call his office tomorrow and they do same-day appointments for kidney [facility] patients.  Patient is a minimal to this plan.  He was given strict return precautions.  Final Clinical Impressions(s) / ED Diagnoses   Final diagnoses:  Kidney Doubleday    ED Discharge Orders         Ordered     HYDROmorphone (DILAUDID) 2 MG tablet  Every 6 hours PRN     11/06/17 2244           Rolan Bucco, MD 11/06/17 2249

## 2017-11-06 NOTE — ED Triage Notes (Signed)
Right testicle, right lower abdominal and right flank pain since Wednesday. States blood in his urine and blood on the toilet paper after a bowel movement. nausea and vomiting.

## 2018-11-07 IMAGING — US US RENAL
1 series · 14 of 25 positions shown · non-contrast
Comparison: CT 11/03/2017

CLINICAL DATA: Right flank pain

EXAM:
RENAL / URINARY TRACT ULTRASOUND COMPLETE

[Series 1: us renal · 14 of 40 slices shown]
[im 1/40]
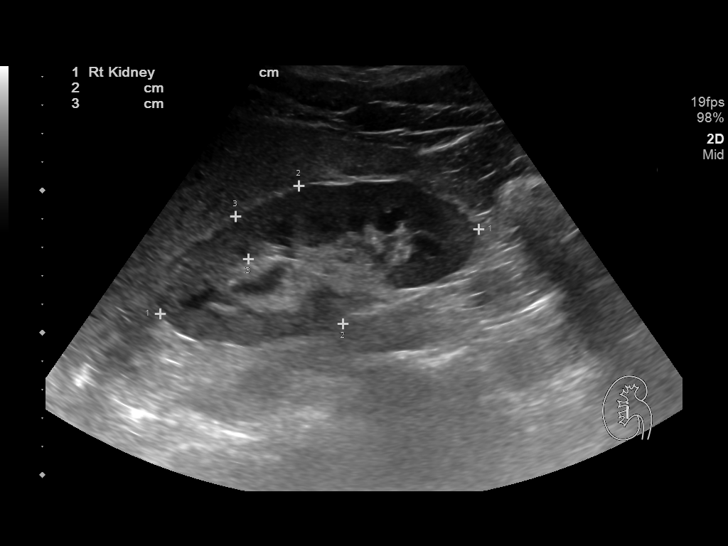
[im 4/40]
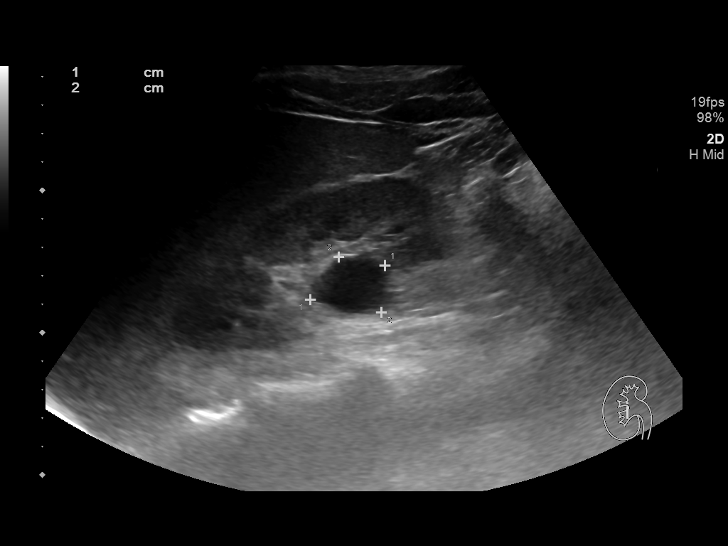
[im 7/40]
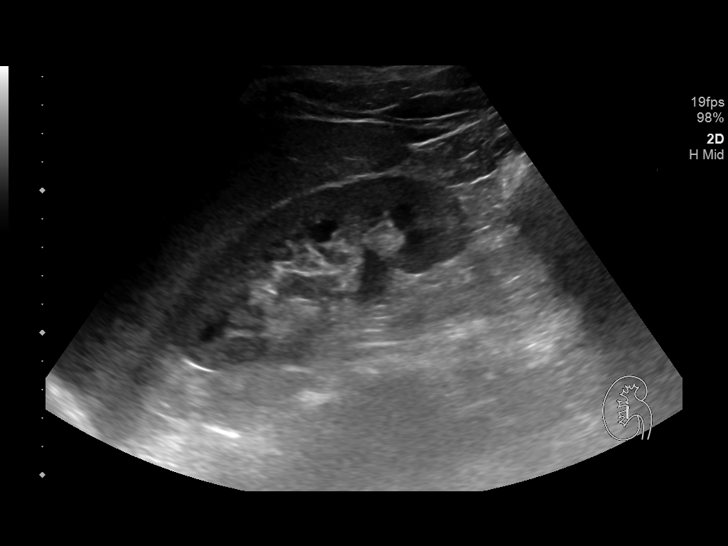
[im 10/40]
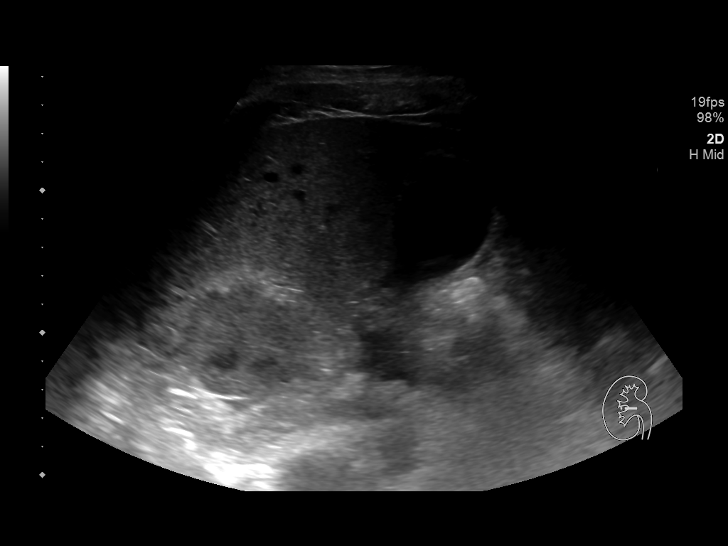
[im 14/40]
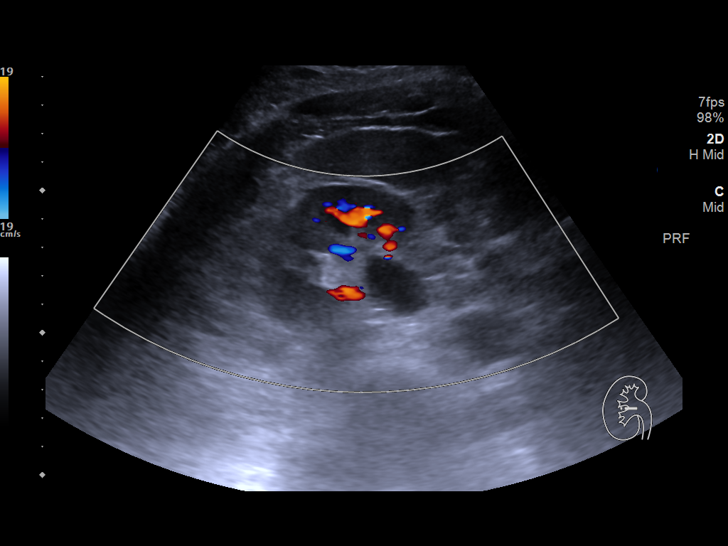
[im 15/40]
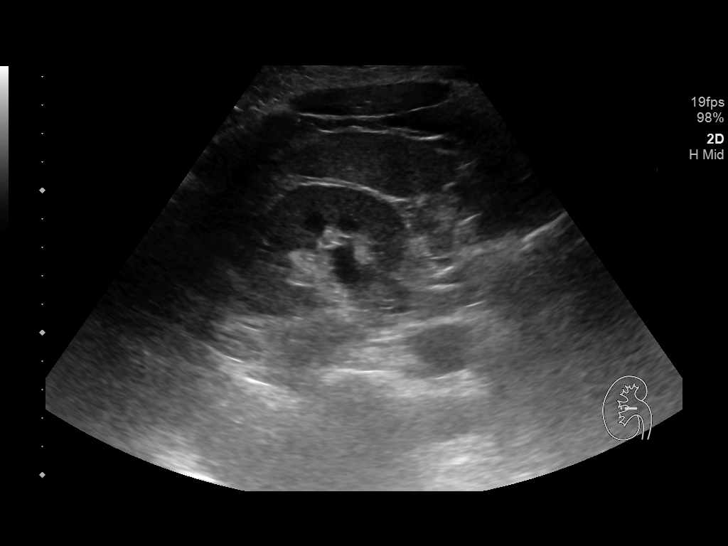
[im 18/40]
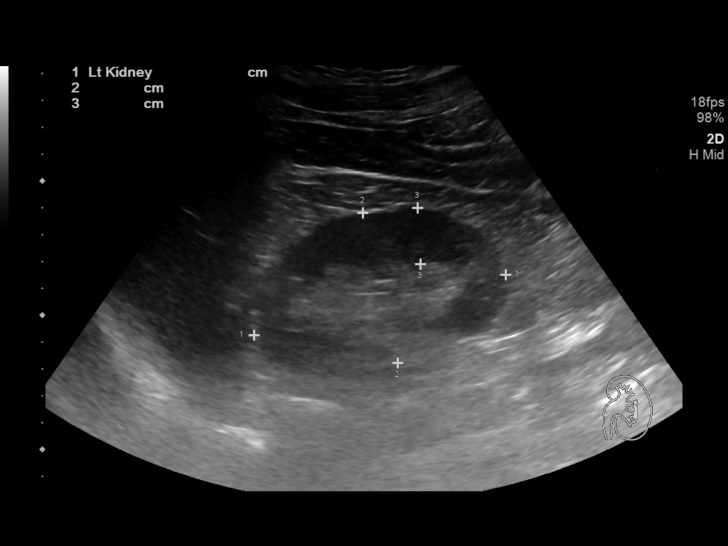
[im 22/40]
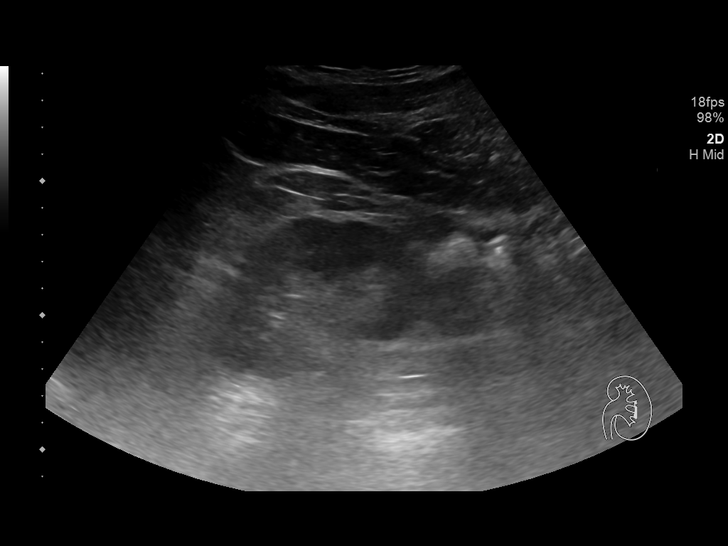
[im 25/40]
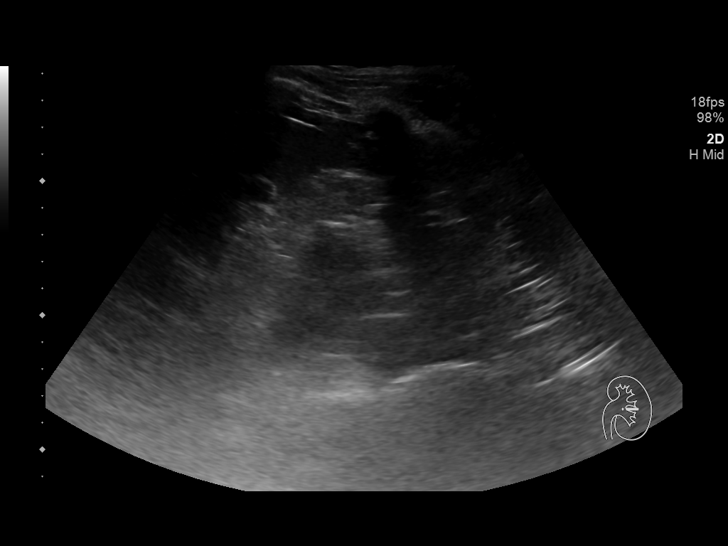
[im 27/40]
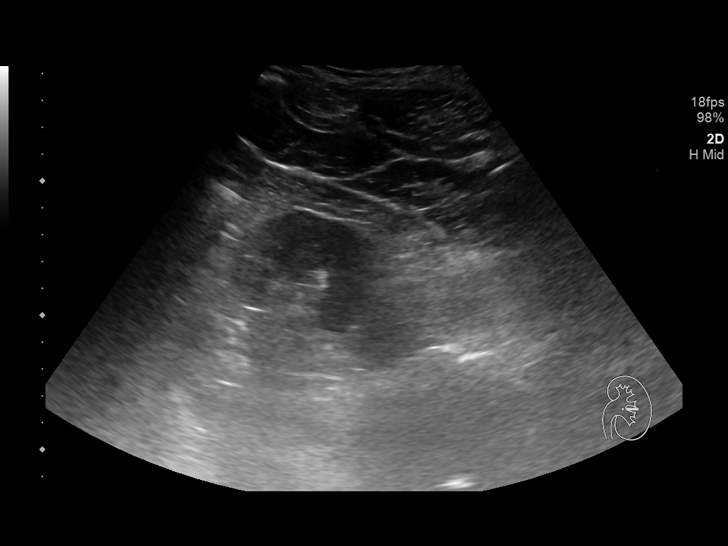
[im 30/40]
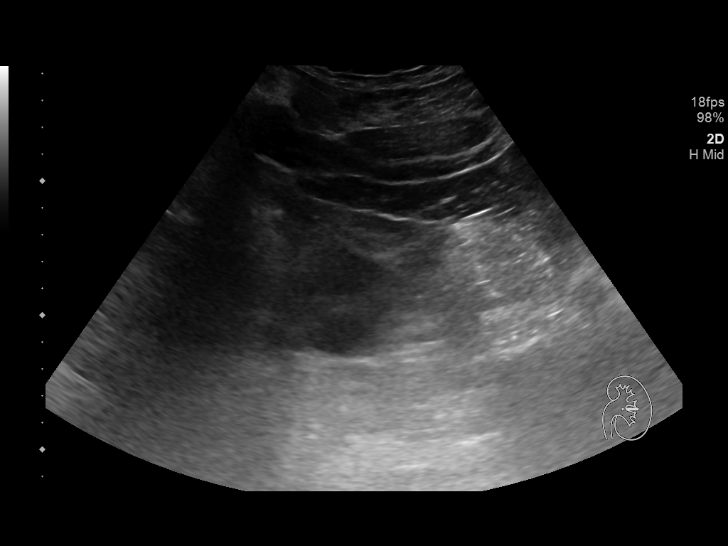
[im 33/40]
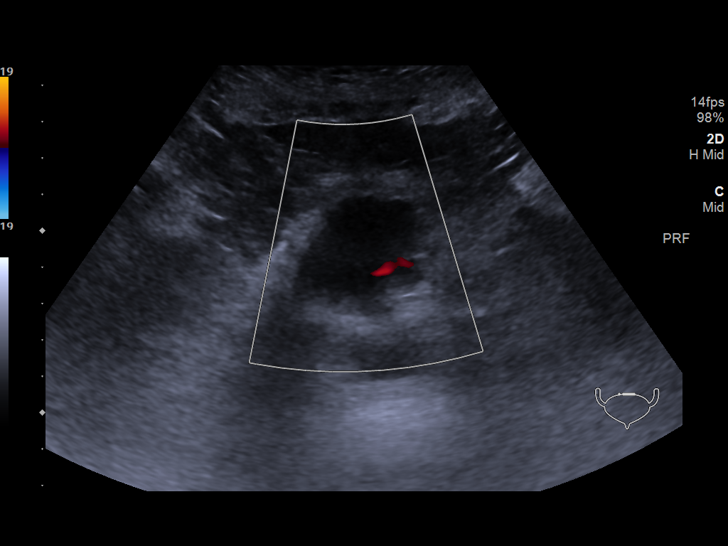
[im 36/40]
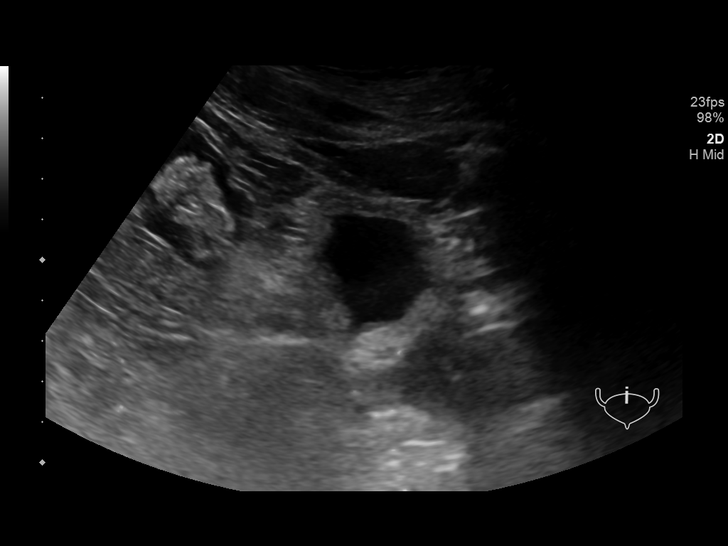
[im 40/40]
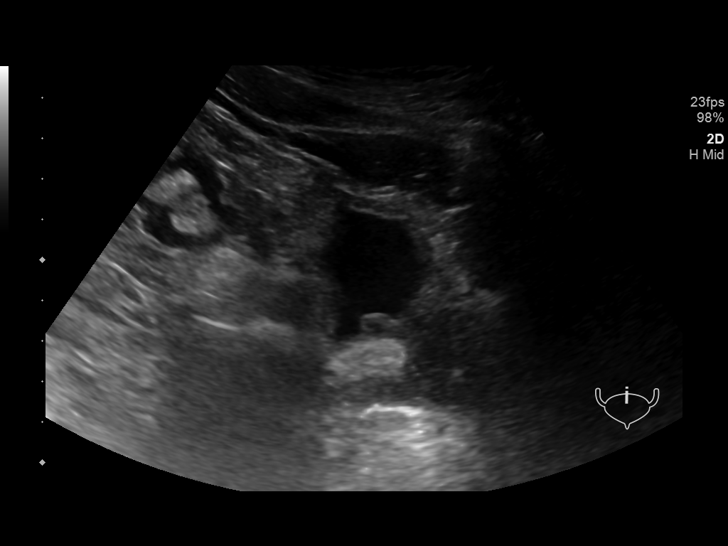

[14 of 25 positions shown; findings below may reference images not displayed]

FINDINGS: Right Kidney:

Length: 11.6 cm. Mild right hydronephrosis, similar to prior CT.
Normal echotexture.

Left Kidney:

Length: 9.6 cm. Echogenicity within normal limits. No mass or
hydronephrosis visualized.

Bladder:

Small posterior cystic area noted along the right posterior bladder
wall measures 7 mm. This could reflect a small ureterocele.
IMPRESSION: Mild right hydronephrosis, similar to prior CT.

Question small ureterocele on the right.

## 2020-01-21 ENCOUNTER — Encounter (HOSPITAL_BASED_OUTPATIENT_CLINIC_OR_DEPARTMENT_OTHER): Payer: Self-pay

## 2020-01-21 ENCOUNTER — Other Ambulatory Visit: Payer: Self-pay

## 2020-01-21 ENCOUNTER — Emergency Department (HOSPITAL_BASED_OUTPATIENT_CLINIC_OR_DEPARTMENT_OTHER)
Admission: EM | Admit: 2020-01-21 | Discharge: 2020-01-21 | Disposition: A | Discharge status: Home / Self Care | Payer: Medicaid Other | Attending: Emergency Medicine | Admitting: Emergency Medicine

## 2020-01-21 DIAGNOSIS — K921 Melena: Secondary | ICD-10-CM | POA: Diagnosis not present

## 2020-01-21 DIAGNOSIS — R109 Unspecified abdominal pain: Secondary | ICD-10-CM | POA: Diagnosis not present

## 2020-01-21 DIAGNOSIS — Z5321 Procedure and treatment not carried out due to patient leaving prior to being seen by health care provider: Secondary | ICD-10-CM | POA: Diagnosis not present

## 2020-01-21 NOTE — ED Triage Notes (Signed)
Pt c/o right side abd pain, bloody stools-started 4 days ago-NAD-steady gait
